# Patient Record
Sex: Female | Born: 1975 | Race: Black or African American | Hispanic: No | Marital: Single | Smoking: Never smoker
Health system: Southern US, Community
[De-identification: ages and names within clinical notes are randomized; demographics above are authoritative.]

---

## 2001-11-25 ENCOUNTER — Ambulatory Visit (HOSPITAL_COMMUNITY): Admission: RE | Admit: 2001-11-25 | Discharge: 2001-11-25 | Payer: Self-pay | Admitting: Family Medicine

## 2001-11-25 ENCOUNTER — Encounter: Payer: Self-pay | Admitting: Family Medicine

## 2002-06-24 ENCOUNTER — Emergency Department (HOSPITAL_COMMUNITY): Admission: EM | Admit: 2002-06-24 | Discharge: 2002-06-24 | Payer: Self-pay | Admitting: Emergency Medicine

## 2002-06-24 ENCOUNTER — Encounter: Payer: Self-pay | Admitting: Emergency Medicine

## 2003-05-29 ENCOUNTER — Other Ambulatory Visit: Admission: RE | Admit: 2003-05-29 | Discharge: 2003-05-29 | Payer: Self-pay | Admitting: Dermatology

## 2008-06-01 ENCOUNTER — Emergency Department (HOSPITAL_COMMUNITY): Admission: EM | Admit: 2008-06-01 | Discharge: 2008-06-01 | Payer: Self-pay | Admitting: Emergency Medicine

## 2008-06-02 ENCOUNTER — Emergency Department (HOSPITAL_COMMUNITY): Admission: EM | Admit: 2008-06-02 | Discharge: 2008-06-02 | Payer: Self-pay | Admitting: Emergency Medicine

## 2012-10-10 ENCOUNTER — Other Ambulatory Visit (HOSPITAL_COMMUNITY): Payer: Self-pay | Admitting: Family Medicine

## 2012-10-10 DIAGNOSIS — R1310 Dysphagia, unspecified: Secondary | ICD-10-CM

## 2012-10-12 ENCOUNTER — Ambulatory Visit (HOSPITAL_COMMUNITY)
Admission: RE | Admit: 2012-10-12 | Discharge: 2012-10-12 | Disposition: A | Discharge status: Home / Self Care | Payer: Managed Care, Other (non HMO) | Source: Ambulatory Visit | Attending: Family Medicine | Admitting: Family Medicine

## 2012-10-12 DIAGNOSIS — R1310 Dysphagia, unspecified: Secondary | ICD-10-CM | POA: Insufficient documentation

## 2013-12-13 ENCOUNTER — Other Ambulatory Visit (HOSPITAL_COMMUNITY): Payer: Self-pay | Admitting: Physician Assistant

## 2013-12-13 DIAGNOSIS — Z1231 Encounter for screening mammogram for malignant neoplasm of breast: Secondary | ICD-10-CM

## 2013-12-18 ENCOUNTER — Ambulatory Visit (HOSPITAL_COMMUNITY): Admission: RE | Admit: 2013-12-18 | Payer: Managed Care, Other (non HMO) | Source: Ambulatory Visit

## 2014-02-03 IMAGING — RF DG ESOPHAGUS
16 of 24 series · 16 of 24 positions shown · non-contrast
Comparison: None

CLINICAL DATA: Solid food dysphagia at mid chest for a couple weeks

ESOPHAGUS/BARIUM SWALLOW/TABLET STUDY:
TECHNIQUE: Single contrast, air contrast, and tablet imaging of
the esophagus were performed.
Fluoroscopy time:  2.5 minutes

[Series 1: run · 1 of 1 slices shown (1 of 16)]
[im 1/1]
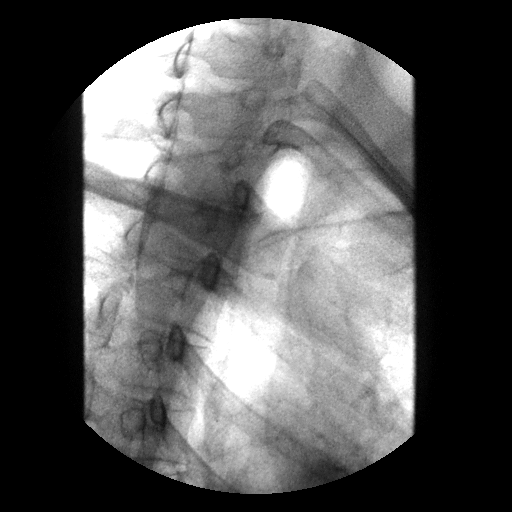

[Series 3: run · 1 of 1 slices shown (2 of 16)]
[im 1/1]
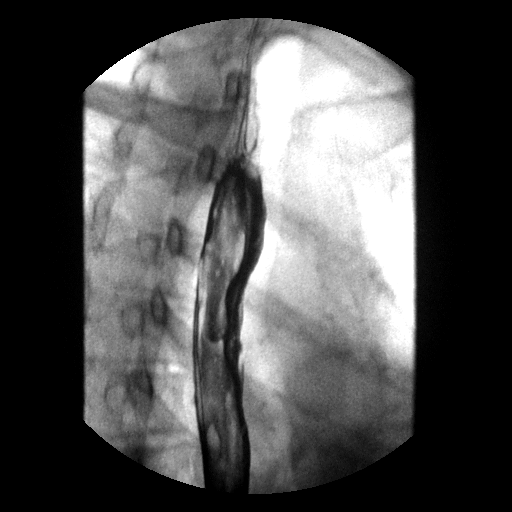

[Series 4: run · 1 of 1 slices shown (3 of 16)]
[im 1/1]
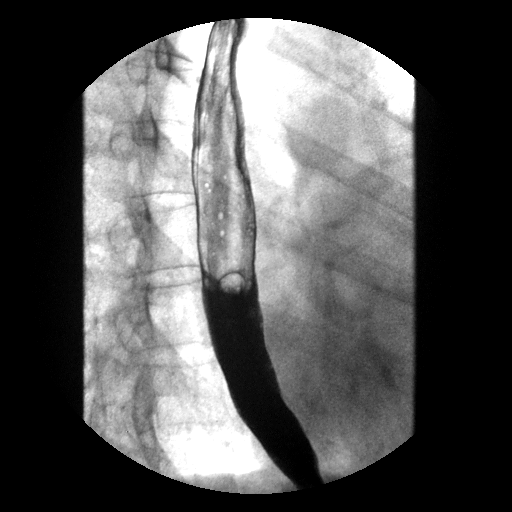

[Series 6: run · 1 of 1 slices shown (4 of 16)]
[im 1/1]
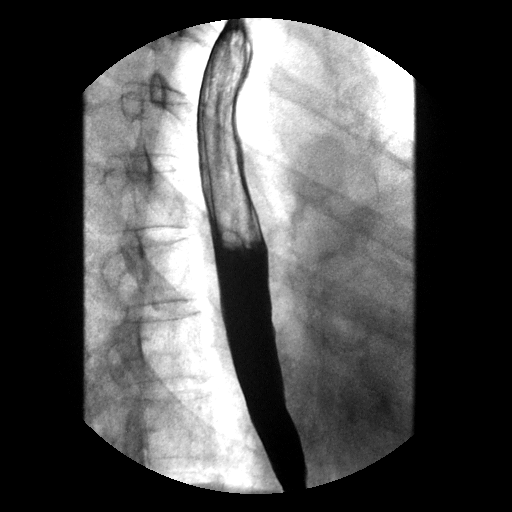

[Series 7: run · 1 of 1 slices shown (5 of 16)]
[im 1/1]
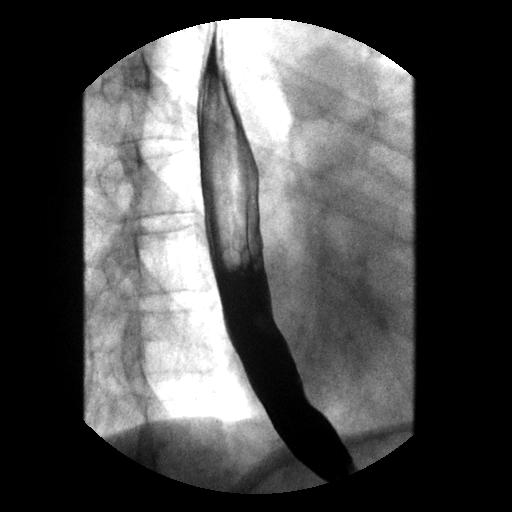

[Series 9: run · 1 of 1 slices shown (6 of 16)]
[im 1/1]
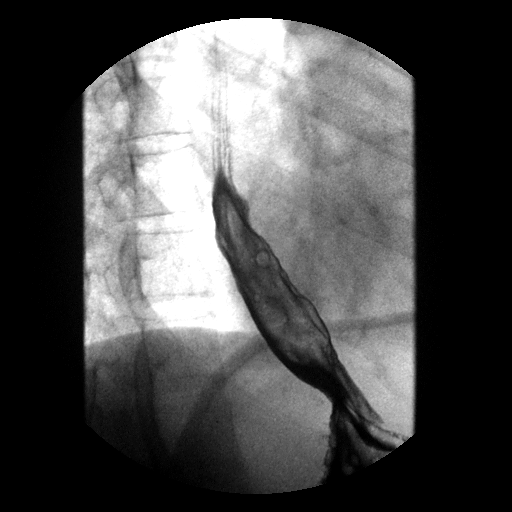

[Series 10: run · 1 of 1 slices shown (7 of 16)]
[im 1/1]
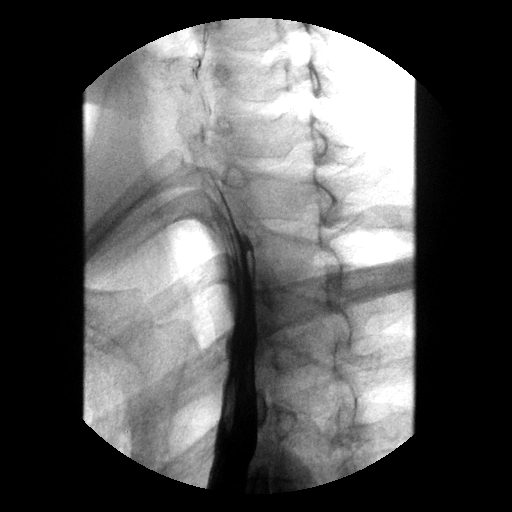

[Series 12: run · 1 of 1 slices shown (8 of 16)]
[im 1/1]
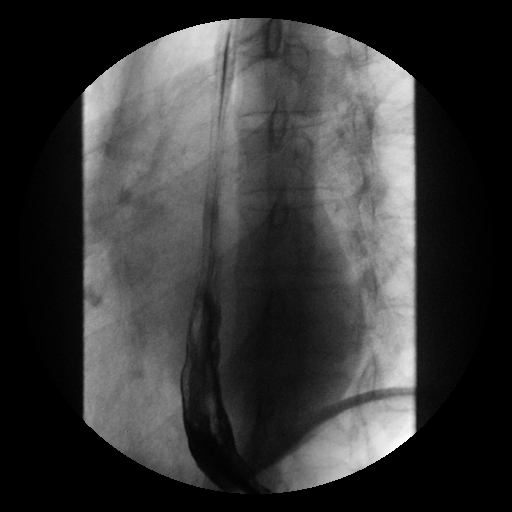

[Series 13: run · 1 of 1 slices shown (9 of 16)]
[im 1/1]
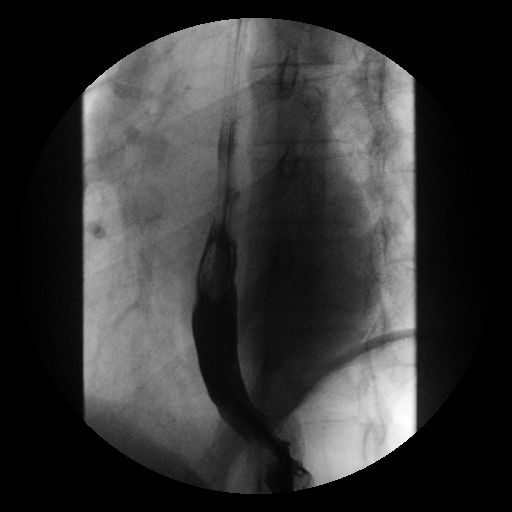

[Series 15: run · 1 of 8 slices shown (10 of 16)]
[im 1/8]
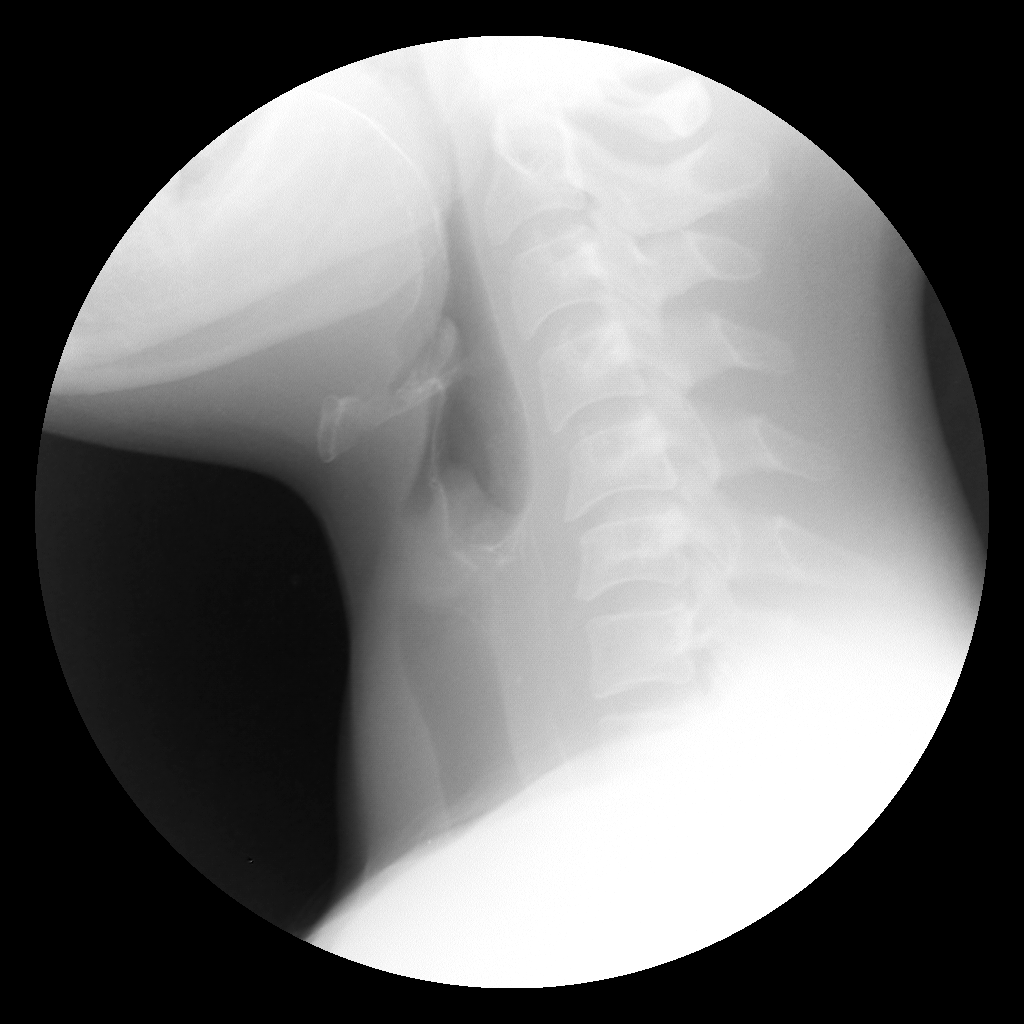

[Series 16: run · 1 of 1 slices shown (11 of 16)]
[im 1/1]
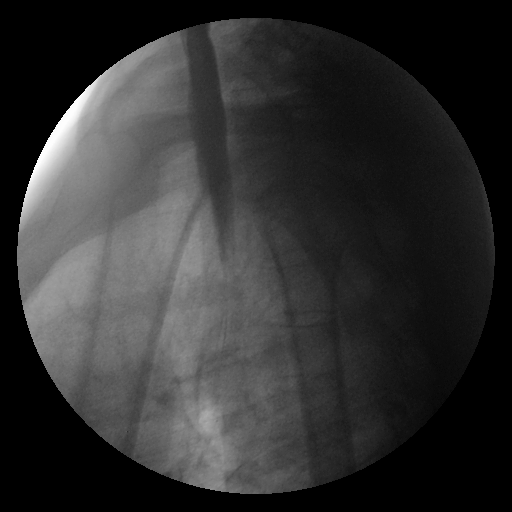

[Series 18: run · 1 of 1 slices shown (12 of 16)]
[im 1/1]
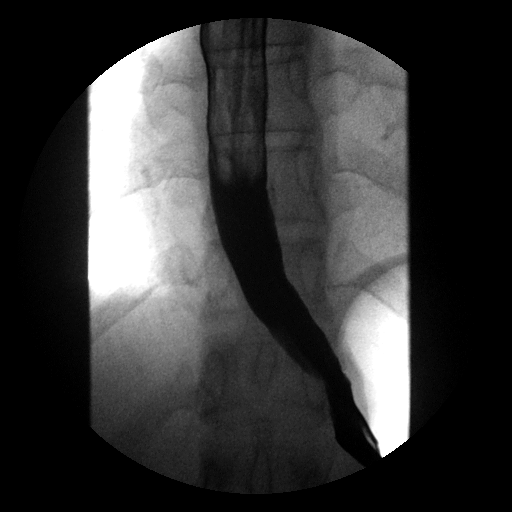

[Series 19: run · 1 of 1 slices shown (13 of 16)]
[im 1/1]
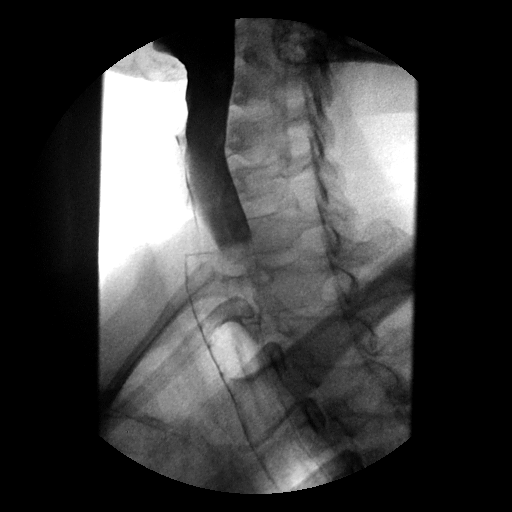

[Series 21: run · 1 of 1 slices shown (14 of 16)]
[im 1/1]
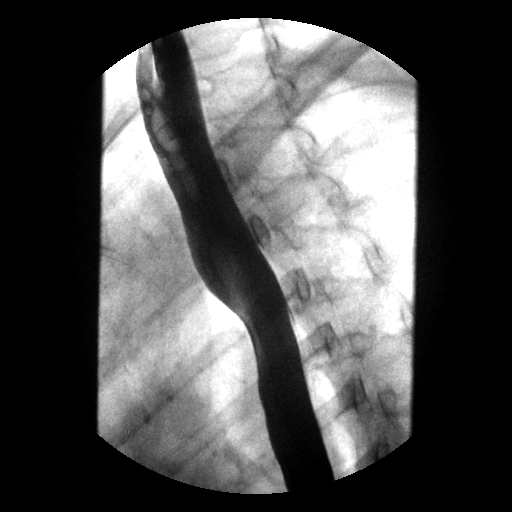

[Series 22: run · 1 of 1 slices shown (15 of 16)]
[im 1/1]
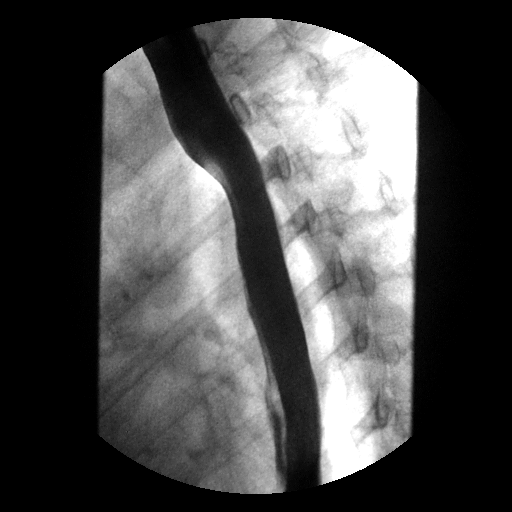

[Series 24: run · 1 of 1 slices shown (16 of 16)]
[im 1/1]
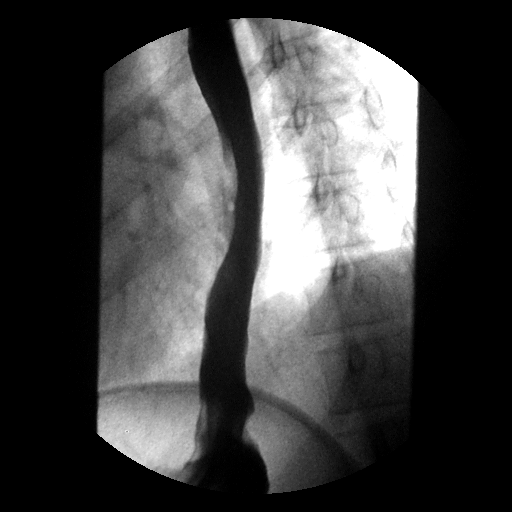

[16 of 24 positions shown; findings below may reference images not displayed]

FINDINGS: Normal esophageal distention and motility.
No esophageal mass or stricture.
12.5 mm diameter barium tablet passes from oral cavity to stomach
without obstruction.
Smooth appearance of esophageal mucosa on air-contrast imaging
without irregularity or ulceration.
Targeted rapid sequence imaging of the cervical esophagus and
hypopharynx shows normal motion without laryngeal penetration or
aspiration.
No persistent intraluminal filling defects.
IMPRESSION: Normal esophagram.

## 2019-08-31 ENCOUNTER — Other Ambulatory Visit: Payer: Self-pay

## 2019-08-31 DIAGNOSIS — Z20822 Contact with and (suspected) exposure to covid-19: Secondary | ICD-10-CM

## 2019-09-01 LAB — NOVEL CORONAVIRUS, NAA: SARS-CoV-2, NAA: NOT DETECTED

## 2020-11-06 ENCOUNTER — Other Ambulatory Visit: Payer: Managed Care, Other (non HMO) | Admitting: Adult Health

## 2024-02-29 ENCOUNTER — Ambulatory Visit
Admission: EM | Admit: 2024-02-29 | Discharge: 2024-02-29 | Disposition: A | Discharge status: Home / Self Care | Attending: Family Medicine | Admitting: Family Medicine

## 2024-02-29 DIAGNOSIS — K146 Glossodynia: Secondary | ICD-10-CM | POA: Insufficient documentation

## 2024-02-29 DIAGNOSIS — Z113 Encounter for screening for infections with a predominantly sexual mode of transmission: Secondary | ICD-10-CM | POA: Diagnosis not present

## 2024-02-29 DIAGNOSIS — Z79899 Other long term (current) drug therapy: Secondary | ICD-10-CM | POA: Diagnosis not present

## 2024-02-29 NOTE — ED Provider Notes (Signed)
 [facility]    CSN: [id number] Arrival date & time: 02/29/24  1655      History   Chief Complaint No chief complaint on file.   HPI Suzanne Ryan is a 48 y.o. female.   Patient presenting today with tongue soreness for the past day or so.  Denies white coating, ulcerations, throat itching or swelling, difficulty breathing or swallowing.  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  Denies any vaginal symptoms.    History reviewed. No pertinent past medical history.  There are no active problems to display for this patient.   History reviewed. No pertinent surgical history.  OB History   No obstetric history on file.      Home Medications    Prior to Admission medications   Medication Sig Start Date End Date Taking? Authorizing Provider  levETIRAcetam (KEPPRA) 1000 MG tablet Take 500 mg by mouth.    [provider]  nortriptyline (PAMELOR) 10 MG capsule Take 30 mg by mouth at bedtime.    [provider]  tiZANidine (ZANAFLEX) 4 MG tablet Take 4 mg by mouth every 8 (eight) hours as needed.    [provider]  topiramate (TOPAMAX) 25 MG tablet Take 25 mg by mouth 2 (two) times daily.    [provider]    Family History History reviewed. No pertinent family history.  Social History Social History   Tobacco Use   Smoking status: Never   Smokeless tobacco: Never     Allergies   Doxycycline and Dexamethasone   Review of Systems Review of Systems Per HPI  Physical Exam Triage Vital Signs ED Triage Vitals  Encounter Vitals Group     BP 02/29/24 1734 (!) 137/90     Systolic BP Percentile --      Diastolic BP Percentile --      Pulse Rate 02/29/24 1734 (!) 103     Resp 02/29/24 1734 16     Temp 02/29/24 1734 98.3 F (36.8 C)     Temp Source 02/29/24 1734 Oral     SpO2 02/29/24 1734 95 %     Weight --      Height --      Head  Circumference --      Peak Flow --      Pain Score 02/29/24 1735 10     Pain Loc --      Pain Education --      Exclude from Growth Chart --    No data found.  Updated Vital Signs BP (!) 137/90 (BP Location: Right Arm)   Pulse (!) 103   Temp 98.3 F (36.8 C) (Oral)   Resp 16   SpO2 95%   Visual Acuity Right Eye Distance:   Left Eye Distance:   Bilateral Distance:    Right Eye Near:   Left Eye Near:    Bilateral Near:     Physical Exam Vitals and nursing note reviewed.  Constitutional:      Appearance: Normal appearance. She is not ill-appearing.  HENT:     Head: Atraumatic.     Mouth/Throat:     Mouth: Mucous membranes are moist.     Comments: Oral mucosa erythematous, no ulcerations or discharge present Eyes:     Extraocular Movements: Extraocular movements intact.     Conjunctiva/sclera: Conjunctivae normal.  Cardiovascular:     Rate and Rhythm: Normal rate.  Pulmonary:     Effort: Pulmonary effort is normal.  Genitourinary:    Comments: GU exam deferred, self swab performed Musculoskeletal:        General: Normal range of motion.     Cervical back: Normal range of motion and neck supple.  Skin:    General: Skin is warm and dry.  Neurological:     Mental Status: She is alert and oriented to person, place, and time.  Psychiatric:        Mood and Affect: Mood normal.        Thought Content: Thought content normal.        Judgment: Judgment normal.      UC Treatments / Results  Labs (all labs ordered are listed, but only abnormal results are displayed) Labs Reviewed  HIV ANTIBODY (ROUTINE TESTING W REFLEX)  RPR  CERVICOVAGINAL ANCILLARY ONLY  CYTOLOGY, (ORAL, ANAL, URETHRAL) ANCILLARY ONLY    EKG   Radiology No results found.  Procedures Procedures (including critical care time)  Medications Ordered in UC Medications - No data to display  Initial Impression / Assessment and Plan / UC Course  I have reviewed the triage vital signs and  the nursing notes.  Pertinent labs & imaging results that were available during my care of the patient were reviewed by me and considered in my medical decision making (see chart for details).     Oral, vaginal swab and HIV and syphilis labs pending.  Treat based on results.  Safe sexual practices reviewed.  Final Clinical Impressions(s) / UC Diagnoses   Final diagnoses:  Screening examination for STI  Soreness of tongue     Discharge Instructions      We will let you know if any of your test results come back positive.  ED Prescriptions   None    PDMP not reviewed this encounter.   Corbin Dess, [place] 02/29/24 1946

## 2024-02-29 NOTE — Discharge Instructions (Signed)
 We will let you know if any of your test results come back positive.

## 2024-02-29 NOTE — ED Triage Notes (Addendum)
 Pt reports tongue pain, was recently exposed to herpes. Pt would like oral swab, vaginal swab, and blood work.

## 2024-03-01 ENCOUNTER — Ambulatory Visit (HOSPITAL_COMMUNITY): Payer: Self-pay

## 2024-03-01 LAB — CERVICOVAGINAL ANCILLARY ONLY
Bacterial Vaginitis (gardnerella): POSITIVE — AB
Candida Glabrata: NEGATIVE
Candida Vaginitis: NEGATIVE
Chlamydia: NEGATIVE
Comment: NEGATIVE
Comment: NEGATIVE
Comment: NEGATIVE
Comment: NEGATIVE
Comment: NEGATIVE
Comment: NORMAL
Neisseria Gonorrhea: NEGATIVE
Trichomonas: NEGATIVE

## 2024-03-01 LAB — CYTOLOGY, (ORAL, ANAL, URETHRAL) ANCILLARY ONLY
Chlamydia: NEGATIVE
Comment: NEGATIVE
Comment: NORMAL
Neisseria Gonorrhea: NEGATIVE

## 2024-03-02 LAB — RPR: RPR Ser Ql: NONREACTIVE

## 2024-03-02 LAB — HIV ANTIBODY (ROUTINE TESTING W REFLEX): HIV Screen 4th Generation wRfx: NONREACTIVE
